# Patient Record
Sex: Female | Born: 1985 | Race: Black or African American | Hispanic: No | Marital: Single | Smoking: Light tobacco smoker
Health system: Southern US, Community
[De-identification: ages and names within clinical notes are randomized; demographics above are authoritative.]

## PROBLEM LIST (undated history)

## (undated) DIAGNOSIS — J45909 Unspecified asthma, uncomplicated: Secondary | ICD-10-CM

---
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
## 2004-05-31 ENCOUNTER — Emergency Department (HOSPITAL_COMMUNITY): Admission: EM | Admit: 2004-05-31 | Discharge: 2004-05-31 | Payer: Self-pay | Admitting: Family Medicine

## 2004-06-19 ENCOUNTER — Emergency Department (HOSPITAL_COMMUNITY): Admission: EM | Admit: 2004-06-19 | Discharge: 2004-06-19 | Payer: Self-pay | Admitting: Family Medicine

## 2004-08-14 ENCOUNTER — Emergency Department (HOSPITAL_COMMUNITY): Admission: EM | Admit: 2004-08-14 | Discharge: 2004-08-14 | Payer: Self-pay | Admitting: Family Medicine

## 2007-01-28 ENCOUNTER — Other Ambulatory Visit: Admission: RE | Admit: 2007-01-28 | Discharge: 2007-01-28 | Payer: Self-pay | Admitting: Obstetrics and Gynecology

## 2007-02-12 ENCOUNTER — Emergency Department (HOSPITAL_COMMUNITY): Admission: EM | Admit: 2007-02-12 | Discharge: 2007-02-12 | Payer: Self-pay | Admitting: Emergency Medicine

## 2007-04-08 ENCOUNTER — Ambulatory Visit (HOSPITAL_COMMUNITY): Admission: RE | Admit: 2007-04-08 | Discharge: 2007-04-08 | Payer: Self-pay | Admitting: Obstetrics and Gynecology

## 2007-06-03 ENCOUNTER — Ambulatory Visit (HOSPITAL_COMMUNITY): Admission: RE | Admit: 2007-06-03 | Discharge: 2007-06-03 | Payer: Self-pay | Admitting: Obstetrics and Gynecology

## 2007-07-08 ENCOUNTER — Inpatient Hospital Stay (HOSPITAL_COMMUNITY): Admission: AD | Admit: 2007-07-08 | Discharge: 2007-07-08 | Payer: Self-pay | Admitting: Obstetrics and Gynecology

## 2007-07-15 ENCOUNTER — Ambulatory Visit (HOSPITAL_COMMUNITY): Admission: RE | Admit: 2007-07-15 | Discharge: 2007-07-15 | Payer: Self-pay | Admitting: Obstetrics and Gynecology

## 2007-07-22 ENCOUNTER — Inpatient Hospital Stay (HOSPITAL_COMMUNITY): Admission: AD | Admit: 2007-07-22 | Discharge: 2007-07-22 | Payer: Self-pay | Admitting: Obstetrics and Gynecology

## 2007-08-03 ENCOUNTER — Inpatient Hospital Stay (HOSPITAL_COMMUNITY): Admission: AD | Admit: 2007-08-03 | Discharge: 2007-08-03 | Payer: Self-pay | Admitting: Obstetrics and Gynecology

## 2007-08-16 ENCOUNTER — Inpatient Hospital Stay (HOSPITAL_COMMUNITY): Admission: AD | Admit: 2007-08-16 | Discharge: 2007-08-19 | Payer: Self-pay | Admitting: Obstetrics and Gynecology

## 2007-08-17 ENCOUNTER — Encounter (INDEPENDENT_AMBULATORY_CARE_PROVIDER_SITE_OTHER): Payer: Self-pay | Admitting: Obstetrics and Gynecology

## 2007-09-29 ENCOUNTER — Encounter: Admission: RE | Admit: 2007-09-29 | Discharge: 2007-10-27 | Payer: Self-pay | Admitting: Orthopedic Surgery

## 2007-10-05 ENCOUNTER — Other Ambulatory Visit: Admission: RE | Admit: 2007-10-05 | Discharge: 2007-10-05 | Payer: Self-pay | Admitting: Obstetrics and Gynecology

## 2009-03-17 IMAGING — US US OB COMP +14 WK
1 series · 14 of 28 positions shown · non-contrast
Comparison: none

OBSTETRICAL ULTRASOUND:

 This ultrasound exam was performed in the [HOSPITAL] Ultrasound Department.  The OB US report was generated in the AS system, and faxed to the ordering physician.  This report is also available in [REDACTED] PACS.

[Series 1: us ob comp +14 wk · 0.28mm/px · 14 of 61 slices shown]
[im 3/61]
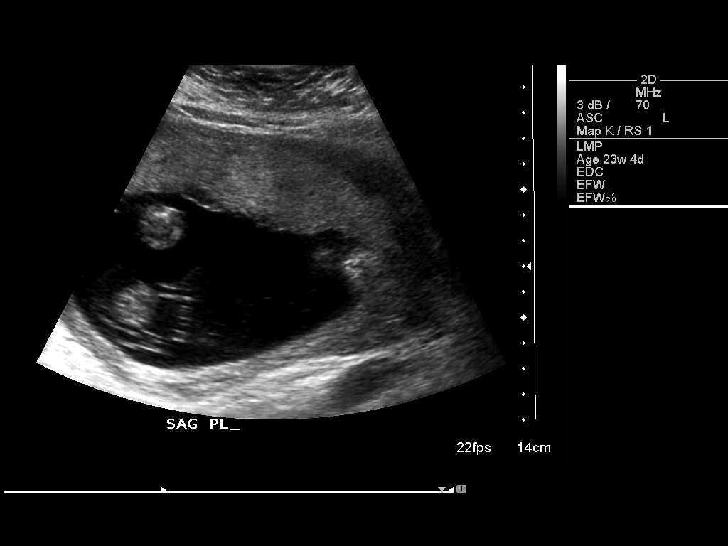
[im 7/61]
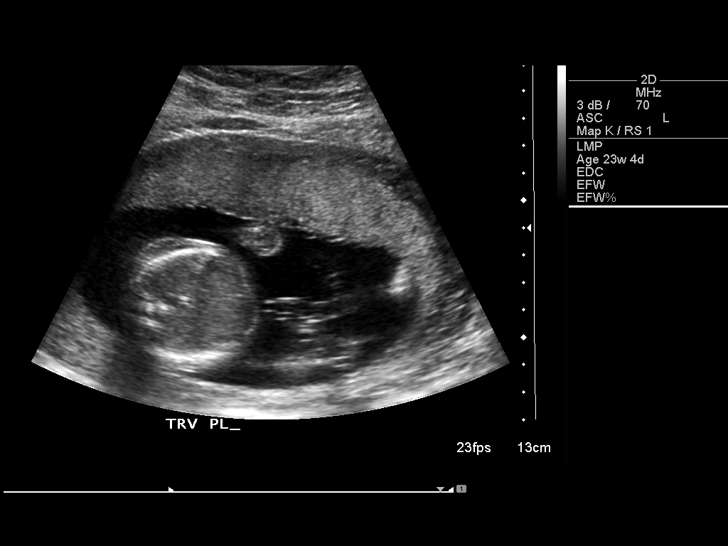
[im 12/61]
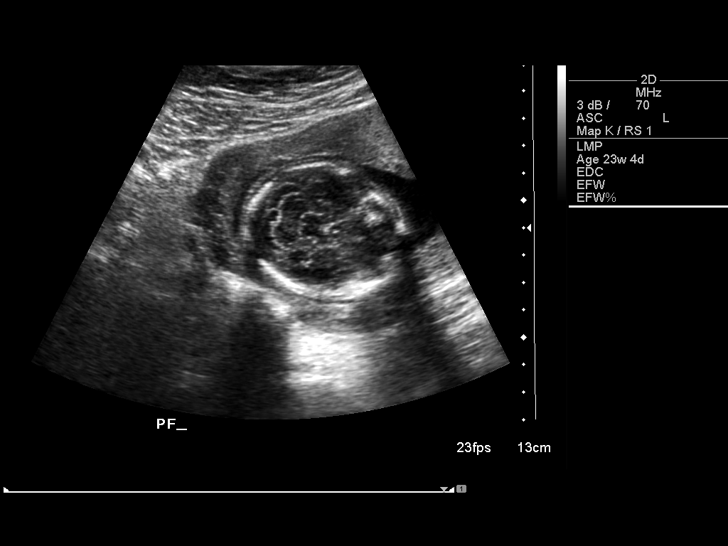
[im 16/61]
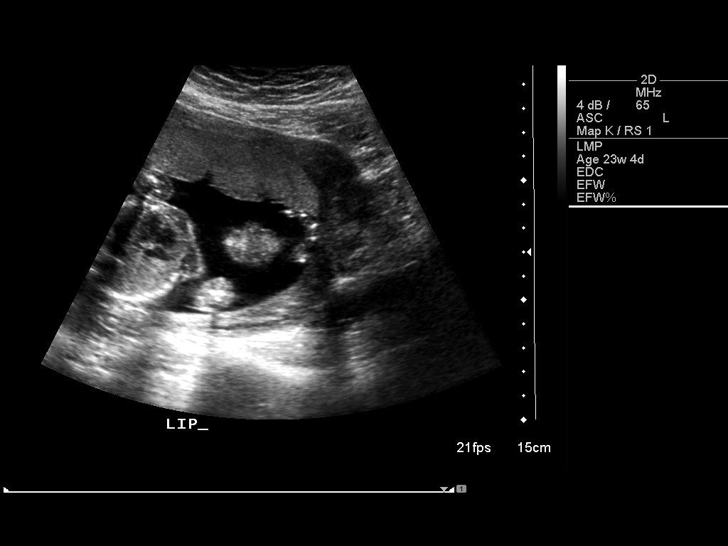
[im 21/61]
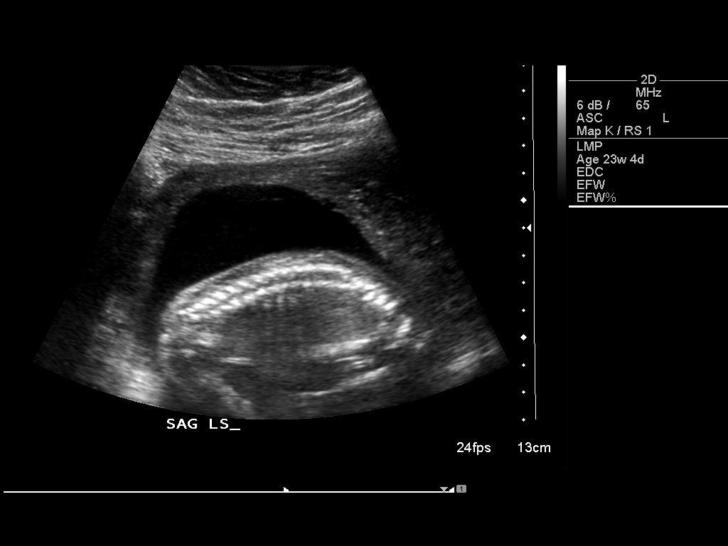
[im 25/61]
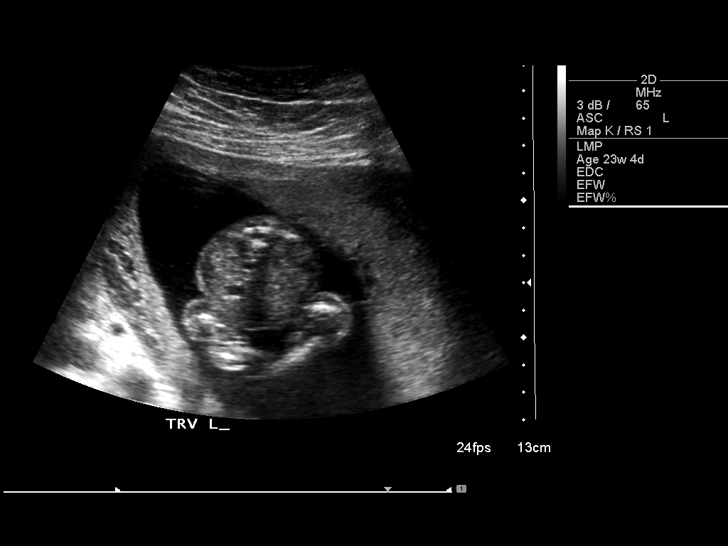
[im 29/61]
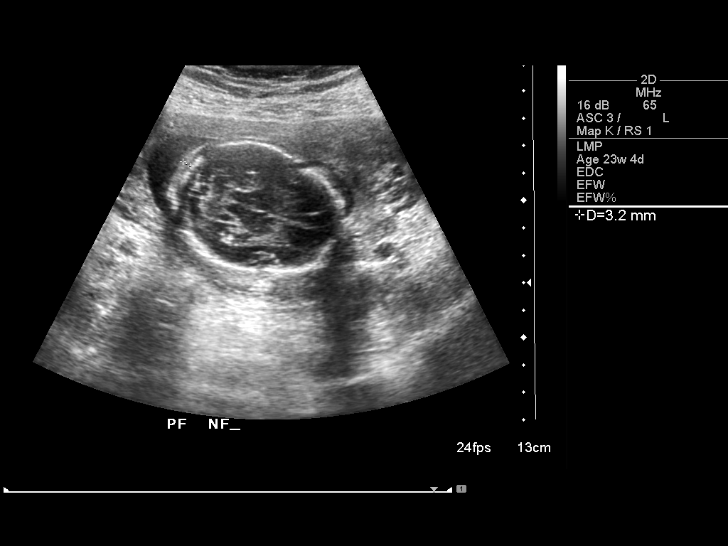
[im 34/61]
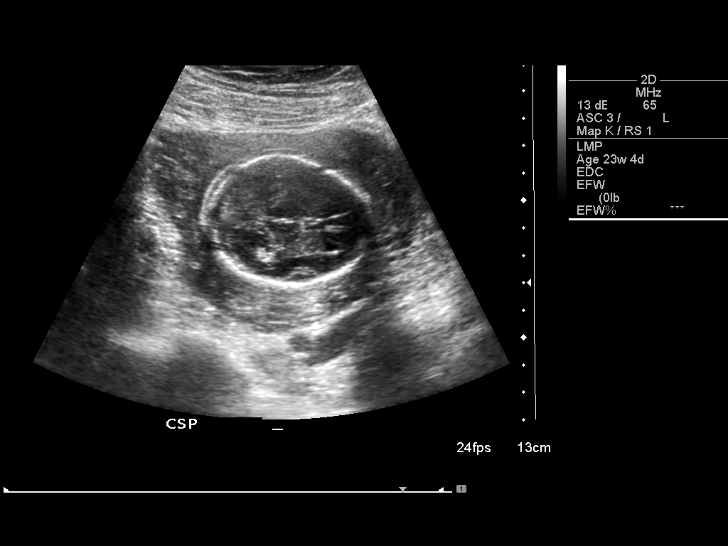
[im 38/61]
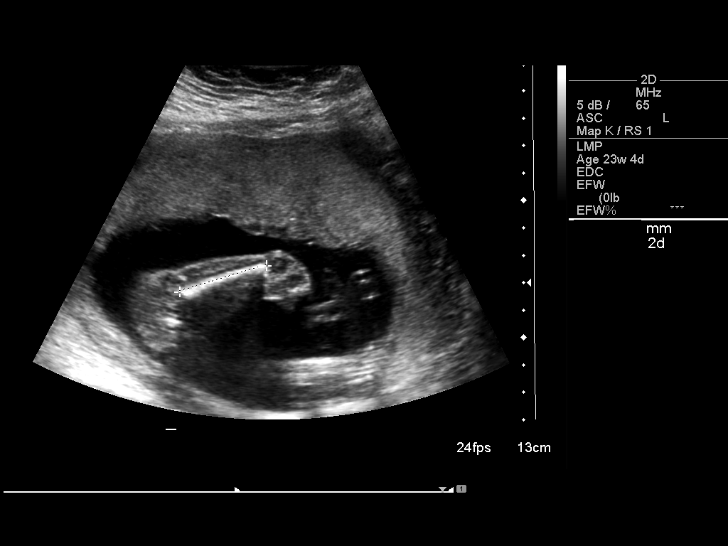
[im 43/61]
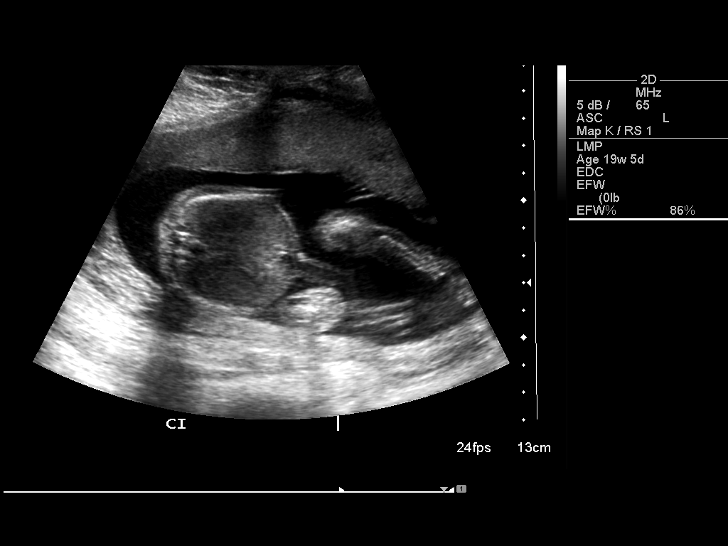
[im 47/61]
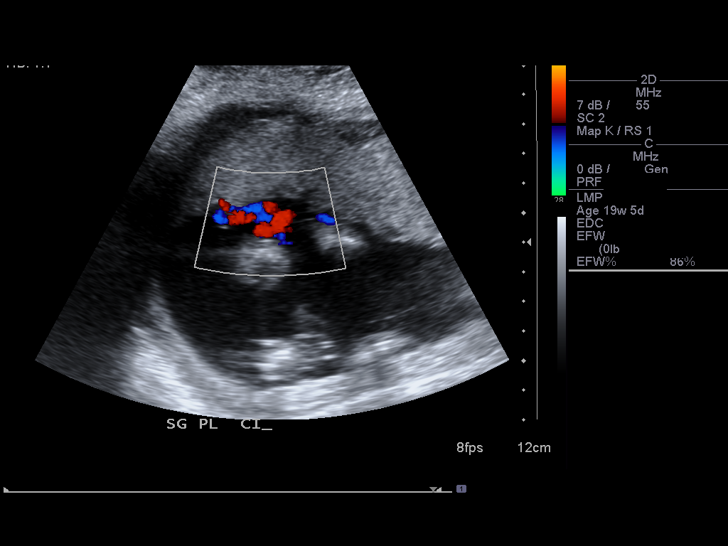
[im 52/61]
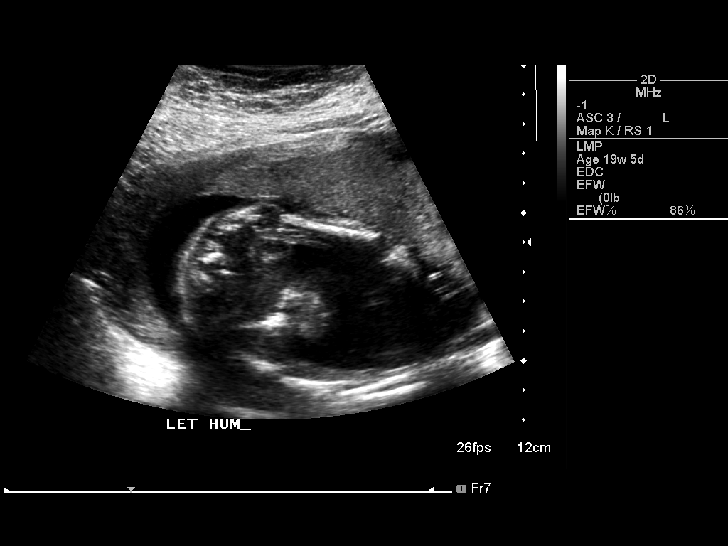
[im 56/61]
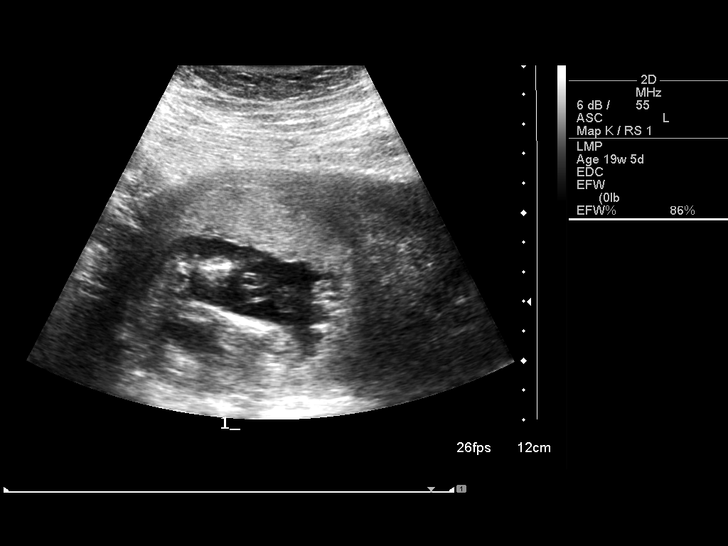
[im 61/61]
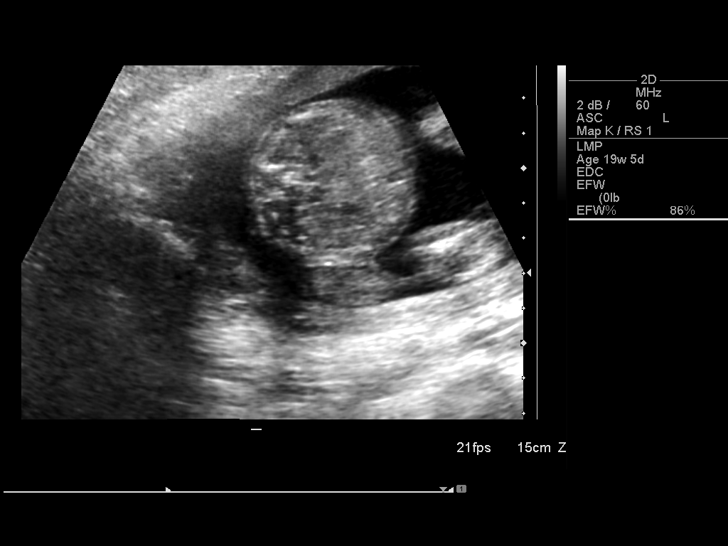

[14 of 28 positions shown; findings below may reference images not displayed]

IMPRESSION: See AS Obstetric US report.

## 2011-01-20 ENCOUNTER — Encounter: Payer: Self-pay | Admitting: Obstetrics and Gynecology

## 2011-05-14 NOTE — Discharge Summary (Signed)
Meghan Rojas, Meghan Rojas              ACCOUNT NO.:  [id number]   MEDICAL RECORD NO.:  [id number]          PATIENT TYPE:  INP   LOCATION:  9148                          FACILITY:  [facility]   PHYSICIAN:  James A. Ashley Royalty, M.D.DATE OF BIRTH:  September 22, 1986   DATE OF ADMISSION:  08/16/2007  DATE OF DISCHARGE:  08/19/2007                               DISCHARGE SUMMARY   DISCHARGE DIAGNOSES:  1. Intrauterine pregnancy at term, delivered.  2. Asthma.  3. Premature rupture of membranes for oligohydramnios.  4. Term birth living child, vertex.   OPERATIONS AND PROCEDURES:  OB delivery with episiotomy, episiorrhaphy.   CONSULTATIONS:  None.   DISCHARGE MEDICATIONS:  Percocet, Motrin 600 mg.   HISTORY AND PHYSICAL:  This is a 25 year old gravida 1 at 25 weeks 3  days gestation with the aforementioned diagnoses.  The patient also  noted to have a two-vessel umbilical cord.  She has been followed with  antepartum surveillance for that.  She presented complaining of possible  rupture of membranes.  Though the evaluation did not reveal ruptured  membranes, an ultrasound performed in the course of evaluation revealed  an AFI to be 2.8, less than the 3rd percentile.  The patient was hence  admitted for delivery.  Cervical examination by me approximately 8:50  a.m. the day of admission revealed cervix to be 4-5 cm dilated, 100%  effaced, minus two station, vertex presentation.  For the remainder of  the history and physical, please see chart.   HOSPITAL COURSE:  The patient admitted to [facility] [place].  Initial laboratory studies were drawn.  She went on to labor and deliver  on August 17, 2007.  The infant was a 6 pounds 15 ounces female, Apgars 8  at one minute, 9 at 5 minutes.  Sent to newborn nursery.  The delivery  was accomplished by Dr. Sylvester Harder over second-degree midline  episiotomy, which was repaired without difficulty.  The patient was  discharged home on the second  postpartum day having had an unremarkable  postpartum course.   DISPOSITION:  The patient is to return to [facility] and  Obstetrics in 6 weeks for postpartum evaluation.      James A. Ashley Royalty, M.D.  Electronically Signed     JAM/MEDQ  D:  09/17/2007  T:  09/17/2007  Job:  [id number]

## 2011-10-11 LAB — CBC
HCT: 32.5 — ABNORMAL LOW
HCT: 38.4
Hemoglobin: 11.4 — ABNORMAL LOW
MCHC: 34.2
MCHC: 34.3
MCHC: 35
MCV: 84.6
RBC: 3.85 — ABNORMAL LOW
RBC: 3.97
RBC: 4.55
RDW: 15.7 — ABNORMAL HIGH
RDW: 15.8 — ABNORMAL HIGH
WBC: 12.7 — ABNORMAL HIGH

## 2011-10-11 LAB — RPR: RPR Ser Ql: NONREACTIVE

## 2011-10-15 LAB — CBC
HCT: 35.5 — ABNORMAL LOW
MCV: 85.5
RBC: 4.16

## 2011-10-15 LAB — KLEIHAUER-BETKE STAIN: Fetal Cells %: 0

## 2012-11-01 ENCOUNTER — Encounter (HOSPITAL_COMMUNITY): Payer: Self-pay | Admitting: Nurse Practitioner

## 2012-11-01 ENCOUNTER — Emergency Department (HOSPITAL_COMMUNITY)
Admission: EM | Admit: 2012-11-01 | Discharge: 2012-11-01 | Disposition: A | Discharge status: Home / Self Care | Payer: Self-pay | Attending: Emergency Medicine | Admitting: Emergency Medicine

## 2012-11-01 DIAGNOSIS — J45901 Unspecified asthma with (acute) exacerbation: Secondary | ICD-10-CM | POA: Insufficient documentation

## 2012-11-01 DIAGNOSIS — E669 Obesity, unspecified: Secondary | ICD-10-CM | POA: Insufficient documentation

## 2012-11-01 DIAGNOSIS — R0600 Dyspnea, unspecified: Secondary | ICD-10-CM

## 2012-11-01 DIAGNOSIS — R059 Cough, unspecified: Secondary | ICD-10-CM | POA: Insufficient documentation

## 2012-11-01 DIAGNOSIS — R05 Cough: Secondary | ICD-10-CM | POA: Insufficient documentation

## 2012-11-01 HISTORY — DX: Unspecified asthma, uncomplicated: J45.909

## 2012-11-01 MED ORDER — AEROCHAMBER PLUS FLO-VU SMALL MISC
1.0000 | Freq: Once | Status: DC
  Filled 2012-11-01: qty 1

## 2012-11-01 MED ORDER — AEROCHAMBER PLUS W/MASK MISC
Status: AC
  Filled 2012-11-01: qty 1

## 2012-11-01 MED ORDER — ALBUTEROL SULFATE HFA 108 (90 BASE) MCG/ACT IN AERS
2.0000 | INHALATION_SPRAY | RESPIRATORY_TRACT | Status: DC | PRN
  Administered 2012-11-01: 2 via RESPIRATORY_TRACT
  Filled 2012-11-01 (×2): qty 6.7

## 2012-11-01 NOTE — ED Provider Notes (Signed)
History     CSN: [id number]  Arrival date & time 11/01/12  1054   First MD Initiated Contact with Patient 11/01/12 1130      Chief Complaint  Patient presents with  . Asthma    (Consider location/radiation/quality/duration/timing/severity/associated sxs/prior treatment) HPI Patient developed wheezing and nonproductive cough onset one hour prior to coming here. She was treated with by EMS with a nebulizer treatment. She is breathing normally presently. Completely asymptomatic. She denies fever cough is nonproductive. Has since resolved no other complaint Past Medical History  Diagnosis Date  . Asthma    Last asthma attack 18 years ago History reviewed. No pertinent past surgical history.  History reviewed. No pertinent family history.  History  Substance Use Topics  . Smoking status: Never Smoker   . Smokeless tobacco: Not on file  . Alcohol Use: Yes    OB History    Grav Para Term Preterm Abortions TAB SAB Ect Mult Living                  Review of Systems  Constitutional: Negative.   HENT: Negative.   Respiratory: Positive for cough and wheezing.   Cardiovascular: Negative.   Gastrointestinal: Negative.   Musculoskeletal: Negative.   Skin: Negative.   Neurological: Negative.   Hematological: Negative.   Psychiatric/Behavioral: Negative.   All other systems reviewed and are negative.    Allergies  Review of patient's allergies indicates no known allergies.  Home Medications  No current outpatient prescriptions on file.  BP 132/71  Pulse 94  Temp 99.7 F (37.6 C) (Oral)  Resp 20  SpO2 100%  Physical Exam  Nursing note and vitals reviewed. Constitutional: She appears well-developed and well-nourished.  HENT:  Head: Normocephalic and atraumatic.  Eyes: Conjunctivae normal are normal. Pupils are equal, round, and reactive to light.  Neck: Neck supple. No tracheal deviation present. No thyromegaly present.  Cardiovascular: Normal rate and regular  rhythm.   No murmur heard. Pulmonary/Chest: Effort normal and breath sounds normal.  Abdominal: Soft. Bowel sounds are normal. She exhibits no distension. There is no tenderness.       OBese  Musculoskeletal: Normal range of motion. She exhibits no edema and no tenderness.  Neurological: She is alert. Coordination normal.  Skin: Skin is warm and dry. No rash noted.  Psychiatric: She has a normal mood and affect.    ED Course  Procedures (including critical care time)  Labs Reviewed - No data to display No results found.   No diagnosis found.    MDM  Wheezing and cough resolved with one nebulized treatment. Plan albuterol HFA to go to use 2 puffs every 4 hours when necessary shortness of breath or cough referral resource guide Diagnosis dyspnea        Doug Sou, MD 11/01/12 1308

## 2012-11-01 NOTE — ED Notes (Signed)
Per ems: pt reports history of asthma but has not needed inhalers for past year. Onset SOB this am, on arrival to scene mild wheezing noted sats were 93% on RA, pt appeared anxious. En route ems administered 5mg  albuterol neb and pt is feeling much better

## 2012-12-10 ENCOUNTER — Emergency Department (HOSPITAL_COMMUNITY)
Admission: EM | Admit: 2012-12-10 | Discharge: 2012-12-10 | Disposition: A | Discharge status: Home / Self Care | Payer: Self-pay | Attending: Emergency Medicine | Admitting: Emergency Medicine

## 2012-12-10 ENCOUNTER — Encounter (HOSPITAL_COMMUNITY): Payer: Self-pay | Admitting: Emergency Medicine

## 2012-12-10 DIAGNOSIS — F17200 Nicotine dependence, unspecified, uncomplicated: Secondary | ICD-10-CM | POA: Insufficient documentation

## 2012-12-10 DIAGNOSIS — J45909 Unspecified asthma, uncomplicated: Secondary | ICD-10-CM | POA: Insufficient documentation

## 2012-12-10 DIAGNOSIS — R21 Rash and other nonspecific skin eruption: Secondary | ICD-10-CM | POA: Insufficient documentation

## 2012-12-10 DIAGNOSIS — Z79899 Other long term (current) drug therapy: Secondary | ICD-10-CM | POA: Insufficient documentation

## 2012-12-10 MED ORDER — NYSTATIN-TRIAMCINOLONE 100000-0.1 UNIT/GM-% EX CREA: TOPICAL_CREAM | CUTANEOUS | Status: AC

## 2012-12-10 MED ORDER — CEPHALEXIN 500 MG PO CAPS: 500.0000 mg | ORAL_CAPSULE | Freq: Four times a day (QID) | ORAL | Status: AC

## 2012-12-10 NOTE — ED Provider Notes (Signed)
History     CSN: [id number]  Arrival date & time 12/10/12  6213   First MD Initiated Contact with Patient 12/10/12 1901      No chief complaint on file.   (Consider location/radiation/quality/duration/timing/severity/associated sxs/prior treatment) HPI C/o rash on  Dorsal hand present and worsening over past 2 months. Begins as vesicle.  intensely pruritic, weeping and crusting. Non-painful. Past history of recurrent rash that patient has attributed to eczema, without formal diagnosis.  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  Her children were recently dxd with impetigo,however patient has had rash present longer. Denies contacts with similar,changes in lotions/soaps/detergents, exposure to animal or plant irritants, and denies purulent discharge.   Discussed reasons to seek immediate care. Patient expresses understanding and agrees with plan.  Past Medical History  Diagnosis Date  . Asthma     History reviewed. No pertinent past surgical history.  No family history on file.  History  Substance Use Topics  . Smoking status: Light Tobacco Smoker  . Smokeless tobacco: Not on file  . Alcohol Use: Yes    OB History    Grav Para Term Preterm Abortions TAB SAB Ect Mult Living                  Review of Systems Ten systems reviewed and are negative for acute change, except as noted in the HPI.   Allergies  Review of patient's allergies indicates no known allergies.  Home Medications   Current Outpatient Rx  Name  Route  Sig  Dispense  Refill  . ALBUTEROL SULFATE HFA 108 (90 BASE) MCG/ACT IN AERS   Inhalation   Inhale 2 puffs into the lungs every 6 (six) hours as needed. breathing         . IBUPROFEN 200 MG PO TABS   Oral   Take 400 mg by mouth every 6 (six) hours as needed. Pain           BP 156/107  Pulse 79  Temp 98.6 F (37 C) (Oral)  Resp 20  Wt 245 lb (111.131 kg)  SpO2 100%  Physical Exam Physical Exam  Nursing note  and vitals reviewed. Constitutional: She is oriented to person, place, and time. She appears well-developed and well-nourished. No distress.  HENT:  Head: Normocephalic and atraumatic.  Eyes: Conjunctivae normal and EOM are normal. Pupils are equal, round, and reactive to light. No scleral icterus.  Neck: Normal range of motion.  Cardiovascular: Normal rate, regular rhythm and normal heart sounds.  Exam reveals no gallop and no friction rub.   No murmur heard. Pulmonary/Chest: Effort normal and breath sounds normal. No respiratory distress.  Abdominal: Soft. Bowel sounds are normal. She exhibits no distension and no mass. There is no tenderness. There is no guarding.  Neurological: She is alert and oriented to person, place, and time.  Skin: Skin is warm and dry. She is not diaphoretic.  Area of hypertrophic, thickened plaques on dorsal hand and fingers 2/3/4 . 3 cm annular patch on the anterior surface of the forearm. There serous discharge and crusting on the fingers. No signs of infection.   ED Course  Procedures (including critical care time)  Labs Reviewed - No data to display No results found.   1. Rash       MDM  Patient with dermatitis that may represent eczema, dishydrotic dermatitis , and/or have fungal components as well. I will cover the patient for staph infection and have urged  her to follow up with dermatology. Discussed reasons to seek immediate care. Patient expresses understanding and agrees with plan.        Arthor Captain, PA-C 12/11/12 1131

## 2012-12-10 NOTE — ED Notes (Signed)
Pt with blister like rash on right fourth finger with drainage.  Pt reports her children were diagnosed with impetigo recently and treated with Bactriban and an oral antibiotic.  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

## 2012-12-12 NOTE — ED Provider Notes (Signed)
Medical screening examination/treatment/procedure(s) were performed by non-physician practitioner and as supervising physician I was immediately available for consultation/collaboration.   Rosezella Kronick R Lasharn Bufkin, MD 12/12/12 0113 

## 2016-06-24 ENCOUNTER — Encounter (HOSPITAL_COMMUNITY): Payer: Self-pay | Admitting: *Deleted

## 2016-06-24 ENCOUNTER — Emergency Department (HOSPITAL_COMMUNITY)
Admission: EM | Admit: 2016-06-24 | Discharge: 2016-06-24 | Disposition: A | Discharge status: Home / Self Care | Payer: Self-pay | Attending: Emergency Medicine | Admitting: Emergency Medicine

## 2016-06-24 DIAGNOSIS — J45909 Unspecified asthma, uncomplicated: Secondary | ICD-10-CM | POA: Insufficient documentation

## 2016-06-24 DIAGNOSIS — F17200 Nicotine dependence, unspecified, uncomplicated: Secondary | ICD-10-CM | POA: Insufficient documentation

## 2016-06-24 DIAGNOSIS — K0889 Other specified disorders of teeth and supporting structures: Secondary | ICD-10-CM | POA: Insufficient documentation

## 2016-06-24 MED ORDER — NAPROXEN 500 MG PO TABS: 500.0000 mg | ORAL_TABLET | Freq: Two times a day (BID) | ORAL | Status: AC

## 2016-06-24 MED ORDER — PENICILLIN V POTASSIUM 500 MG PO TABS: 500.0000 mg | ORAL_TABLET | Freq: Four times a day (QID) | ORAL | Status: AC

## 2016-06-24 NOTE — ED Notes (Signed)
Declined W/C at D/C and was escorted to lobby by RN. 

## 2016-06-24 NOTE — ED Provider Notes (Signed)
CSN: [id number]     Arrival date & time 06/24/16  [id number] History  By signing my name below, I, Emmanuella Mensah, attest that this documentation has been prepared under the direction and in the presence of Sharilyn SitesLisa Anand Tejada, PA-C. Electronically Signed: Angelene GiovanniEmmanuella Mensah, ED Scribe. 06/24/2016. 9:51 AM.    Chief Complaint  Patient presents with  . Dental Pain   The history is provided by the patient. No language interpreter was used.   HPI Comments: Meghan Rojas is a 30 y.o. female who presents to the Emergency Department complaining of gradually worsening moderate left upper dental pain onset several days ago. She explains that she had a broken tooth there a while ago but the pain began several days ago. No alleviating factors noted. Pt has not tried any medications PTA. She states that [place] from [place], [place] recently and does not have a Education officer, communitydentist in [place]. She reports NKDA. She denies any fever or trouble swallowing.    Past Medical History  Diagnosis Date  . Asthma    History reviewed. No pertinent past surgical history. History reviewed. No pertinent family history. Social History  Substance Use Topics  . Smoking status: Light Tobacco Smoker  . Smokeless tobacco: None  . Alcohol Use: Yes   OB History    No data available     Review of Systems  Constitutional: Negative for fever.  HENT: Positive for dental problem. Negative for trouble swallowing.   All other systems reviewed and are negative.     Allergies  Fish allergy  Home Medications   Prior to Admission medications   Medication Sig Start Date End Date Taking? Authorizing Provider  albuterol (PROVENTIL HFA;VENTOLIN HFA) 108 (90 BASE) MCG/ACT inhaler Inhale 2 puffs into the lungs every 6 (six) hours as needed. breathing    Historical Provider, MD  cephALEXin (KEFLEX) 500 MG capsule Take 1 capsule (500 mg total) by mouth 4 (four) times daily. 12/10/12   Arthor CaptainAbigail Harris, PA-C  ibuprofen (ADVIL,MOTRIN) 200 MG tablet  Take 400 mg by mouth every 6 (six) hours as needed. Pain    Historical Provider, MD  nystatin-triamcinolone (MYCOLOG II) cream Apply to affected area daily 12/10/12   Arthor CaptainAbigail Harris, PA-C   BP 150/103 mmHg  Pulse 84  Temp(Src) 98.1 F (36.7 C) (Oral)  Resp 18  Ht 5\' 3"  (1.6 m)  Wt 274 lb (124.286 kg)  BMI 48.55 kg/m2  SpO2 100%  LMP 06/10/2016   Physical Exam  Constitutional: She is oriented to person, place, and time. She appears well-developed and well-nourished. No distress.  HENT:  Head: Normocephalic and atraumatic.  Mouth/Throat: Oropharynx is clear and moist.  Teeth largely in fair dentition, left upper molar broken with large defect and cavity present, surrounding gingiva overall normal in appearance, handling secretions appropriately, no trismus, no facial or neck swelling, normal phonation without stridor  Eyes: Conjunctivae and EOM are normal. Pupils are equal, round, and reactive to light.  Neck: Normal range of motion.  Cardiovascular: Normal rate, regular rhythm and normal heart sounds.   Pulmonary/Chest: Effort normal and breath sounds normal. No respiratory distress. She has no wheezes.  Abdominal: Soft. Bowel sounds are normal.  Musculoskeletal: Normal range of motion.  Neurological: She is alert and oriented to person, place, and time.  Skin: Skin is warm and dry. She is not diaphoretic.  Psychiatric: She has a normal mood and affect.  Nursing note and vitals reviewed.   ED Course  Procedures (including critical care time) DIAGNOSTIC STUDIES:  Oxygen Saturation is 100% on RA, normal by my interpretation.    COORDINATION OF CARE: 9:46 AM- Pt advised of plan for treatment and pt agrees. Pt will receive Penicillin. Will provide resources for dental follow up.   MDM   Final diagnoses:  Pain, dental   30 year old female here with left upper dental pain due to a broken tooth. States has been broken for quite some time but has not been bothering her until  recently. Patient is afebrile, nontoxic. Left upper molar is broken with large defect and cavity noted.  There is no facial or neck swelling. Handling secretions well, no stridor. Suspect developing dental infection, no definitive abscess at this time. Clinically not concerning for Ludwig's angina.  Will start on abx, refer to dentist.  Discussed plan with patient, he/she acknowledged understanding and agreed with plan of care.  Return precautions given for new or worsening symptoms.  I personally performed the services described in this documentation, which was scribed in my presence. The recorded information has been reviewed and is accurate.  Garlon HatchetLisa M Jet Traynham, PA-C 06/24/16 1009  Blane OharaJoshua Zavitz, MD 06/24/16 [phone]

## 2016-06-24 NOTE — ED Notes (Signed)
Pt reports pain at site of broken tooth . Toth locatged on Lt upper site.

## 2016-06-24 NOTE — Discharge Instructions (Signed)
Take the prescribed medication as directed. Follow-up with on call dentist-- call their office to make appt.  Otherwise, may follow-up with one of the clinics below. Return to the ED for new or worsening symptoms.  [employer] [facility] [employer] is a great source of information about community services available.  Access by dialing 2-1-1 from anywhere in [place], or by website -  PooledIncome.plwww.nc211.org.   Other Local Resources (Updated 12/2015)  Dental  Care   Services    Phone Number and Address  Cost  [facility] For children 770 - 821 years of age:   Cleaning  Tooth brushing/flossing instruction  Sealants, fillings, crowns  Extractions  Emergency treatment  [phone] [address] [place], [place] [address] Charges based on family income.  Medicaid and some insurance plans accepted.     [facility]  Cleaning  Sealants, fillings, crowns  Extractions  Emergency treatment [phone] [address] [place], [place]  Pregnant women 32 years of age or older with a Medicaid card  [facility] [place]  Cleaning  Sealants, fillings, crowns  Extractions  Emergency treatment [phone] [address] [place], [place] Pregnant women 32 years of age or older with a Medicaid card  [facility] For children 740 - 32 years of age:   Cleaning  Tooth brushing/flossing instruction  Sealants, fillings, crowns  Extractions  Emergency treatment Limited orthodontic services for patients with Medicaid [phone] [address] [place], [place] [address] Medicaid and [facility] Health Choice cover for children up to age 32 and pregnant women.  Parents of children up to age 32 without Medicaid pay a reduced fee at time of service.  [facility] [place]  For children 720 - 32 years of age:   Cleaning  Tooth brushing/flossing instruction  Sealants, fillings, crowns  Extractions  Emergency treatment Limited orthodontic services for patients with Medicaid [phone] [address] [place], [place].  Medicaid and [place] Health Choice cover for children up to age 32 and pregnant women.  Parents of children up to age 32 without Medicaid pay a reduced fee.  [facility]  Cleaning  Sealants, fillings, crowns  Extractions  Hours: Tuesdays and Thursdays, 4:15 - 8 pm [phone] [address], [address] [place], [place] [address] Services free of charge to [facility] residents ages 18-64 who do not have health insurance, Medicare, [place], or [place] benefits and fall within federal poverty guidelines  [facility]    Provides dental care in addition to primary medical care, nutritional counseling, and pharmacy:  Nurse, mental healthCleaning  Sealants, fillings, crowns  Extractions                  [phone] [facility], [address] [place], [place]  [phone] [facility], [address] [place], [place]  [phone] [facility] [place], [place]  [phone] [facility], [address] [place], [place]  [phone] [facility] [address] [place], [place] Accepts [place], [place]are, most insurance.  Also provides services available to all with fees adjusted based on ability to pay.    [facility]  Cleaning  Tooth brushing/flossing instruction  Sealants, fillings, crowns  Extractions  Emergency treatment Hours: Tuesdays, Thursdays,  and Fridays from 8 am to 5 pm by appointment only. [phone] [address] [place], [place] [phone] [facility] residents with Medicaid (depending on eligibility) and children  with [facility] Health Choice - call for more information.  [facility]  Extractions only  Hours: 2nd and 4th Thursday of each month from 6:30 am - 9 am.   [phone] [address] [place], [place] [address] Ages 6418 and older only.  Patients are seen on a first come, first served basis.  [employer]  [employer]  Fillings  Extractions  Orthodontics  Endodontics  Implants/Crowns/Bridges  Complete and partial dentures [phone] [place], [place] Patients must complete an application for services.  There is often a waiting list.

## 2016-10-05 ENCOUNTER — Encounter (HOSPITAL_COMMUNITY): Payer: Self-pay | Admitting: Oncology

## 2016-10-05 ENCOUNTER — Emergency Department (HOSPITAL_COMMUNITY)
Admission: EM | Admit: 2016-10-05 | Discharge: 2016-10-05 | Disposition: A | Discharge status: Home / Self Care | Payer: Self-pay | Attending: Emergency Medicine | Admitting: Emergency Medicine

## 2016-10-05 DIAGNOSIS — Z792 Long term (current) use of antibiotics: Secondary | ICD-10-CM | POA: Insufficient documentation

## 2016-10-05 DIAGNOSIS — R21 Rash and other nonspecific skin eruption: Secondary | ICD-10-CM | POA: Insufficient documentation

## 2016-10-05 DIAGNOSIS — J45909 Unspecified asthma, uncomplicated: Secondary | ICD-10-CM | POA: Insufficient documentation

## 2016-10-05 DIAGNOSIS — Z79899 Other long term (current) drug therapy: Secondary | ICD-10-CM | POA: Insufficient documentation

## 2016-10-05 DIAGNOSIS — F17210 Nicotine dependence, cigarettes, uncomplicated: Secondary | ICD-10-CM | POA: Insufficient documentation

## 2016-10-05 MED ORDER — HYDROCORTISONE 2.5 % EX LOTN: TOPICAL_LOTION | Freq: Two times a day (BID) | CUTANEOUS | 0 refills | Status: AC

## 2016-10-05 MED ORDER — PREDNISONE 20 MG PO TABS: 60.0000 mg | ORAL_TABLET | Freq: Every day | ORAL | 0 refills | Status: AC

## 2016-10-05 MED ORDER — PREDNISONE 20 MG PO TABS
60.0000 mg | ORAL_TABLET | Freq: Once | ORAL | Status: AC
  Administered 2016-10-05: 60 mg via ORAL
  Filled 2016-10-05: qty 3

## 2016-10-05 NOTE — ED Triage Notes (Signed)
Per pt approximately 3 weeks ago she felt a sting on her right foot, did not see an insect.  Since that time pt has developed 2 circular wounds on her right foot.  One measures 1cm x 1cm the other is 4cm x 5 cm both areas are weeping.  Surrounding area is not warm or red.  Swelling is noted to b/l feet.  Pt denies pain however endorses itching at site.  Pt had been using witch hazel on the site and taking benadryl for the itching.

## 2016-10-05 NOTE — ED Provider Notes (Signed)
[facility]-EMERGENCY DEPT Provider Note   CSN: [id number] Arrival date & time: 10/05/16  2024     History   Chief Complaint Chief Complaint  Patient presents with  . Insect Bite    HPI Meghan Rojas is a 30 y.o. female.  Patient presents today with two separate lesions of the dorsal aspect of her right foot.  She states that she felt something sting her 3 weeks ago, but is unsure what it was that stung her.  Two weeks ago she developed the lesions on the foot. She states that the lesions are not painful, but do itch. She states that she has noticed clear drainage from the lesions.  She has been applying Witch Hazel to the lesions.  She denies fever, chills, numbness, or tingling.        Past Medical History:  Diagnosis Date  . Asthma     There are no active problems to display for this patient.   History reviewed. No pertinent surgical history.  OB History    No data available       Home Medications    Prior to Admission medications   Medication Sig Start Date End Date Taking? Authorizing Provider  albuterol (PROVENTIL HFA;VENTOLIN HFA) 108 (90 BASE) MCG/ACT inhaler Inhale 2 puffs into the lungs every 6 (six) hours as needed. breathing    Historical Provider, MD  cephALEXin (KEFLEX) 500 MG capsule Take 1 capsule (500 mg total) by mouth 4 (four) times daily. 12/10/12   Arthor Captain, PA-C  ibuprofen (ADVIL,MOTRIN) 200 MG tablet Take 400 mg by mouth every 6 (six) hours as needed. Pain    Historical Provider, MD  naproxen (NAPROSYN) 500 MG tablet Take 1 tablet (500 mg total) by mouth 2 (two) times daily with a meal. 06/24/16   Garlon Hatchet, PA-C  nystatin-triamcinolone (MYCOLOG II) cream Apply to affected area daily 12/10/12   Arthor Captain, PA-C  penicillin v potassium (VEETID) 500 MG tablet Take 1 tablet (500 mg total) by mouth 4 (four) times daily. 06/24/16   Garlon Hatchet, PA-C    Family History No family history on file.  Social History Social History    Substance Use Topics  . Smoking status: Light Tobacco Smoker    Types: Cigarettes  . Smokeless tobacco: Never Used  . Alcohol use Yes     Allergies   Fish allergy   Review of Systems Review of Systems  All other systems reviewed and are negative.    Physical Exam Updated Vital Signs BP (!) 178/108 (BP Location: Right Arm)   Pulse 80   Temp 98.5 F (36.9 C) (Oral)   Resp 18   Ht 5\' 3"  (1.6 m)   Wt 112 kg   LMP 09/29/2016 (Exact Date)   SpO2 100%   BMI 43.75 kg/m   Physical Exam  Constitutional: She appears well-developed and well-nourished.  HENT:  Head: Normocephalic and atraumatic.  Neck: Normal range of motion. Neck supple.  Cardiovascular: Normal rate, regular rhythm and normal heart sounds.   Pulmonary/Chest: Effort normal and breath sounds normal.  Neurological: She is alert.  Distal sensation of the right foot intact  Skin: Skin is warm and dry.  2 separate hyperpigmented plaque like areas of the dorsal aspect of the right foot with serous drainage.  No purulent drainage. No surrounding erythema, edema, or warmth.  No signs of necrosis.   Psychiatric: She has a normal mood and affect.  Nursing note and vitals reviewed.    ED  Treatments / Results  Labs (all labs ordered are listed, but only abnormal results are displayed) Labs Reviewed - No data to display  EKG  EKG Interpretation None       Radiology No results found.  Procedures Procedures (including critical care time)  Medications Ordered in ED Medications - No data to display   Initial Impression / Assessment and Plan / ED Course  I have reviewed the triage vital signs and the nursing notes.  Pertinent labs & imaging results that were available during my care of the patient were reviewed by me and considered in my medical decision making (see chart for details).  Clinical Course    Final Clinical Impressions(s) / ED Diagnoses   Final diagnoses:  None   Patient presents  today with two separate hyperpigmented scaly plaque like lesions to the dorsal aspect of her foot x 2 weeks.  She states that she was stung by something 3 weeks ago, but is unsure what stung her.  She denies pain, but reports that the lesions itch.  Appearance consistent with eczema.  She does have a history of eczema.  No signs of infection on exam.  No areas of necrosis.  Feel that the patient is stable for discharge.  Given course of prednisone and hydrocortisone cream.  Stable for discharge.  Return precautions given.  Patient also evaluated by Dr Patria Maneampos who is in agreement with the plan.   New Prescriptions New Prescriptions   No medications on file     Santiago GladHeather Marieta Markov, Cordelia Poche-C 10/06/16 2307    Azalia BilisKevin Campos, MD 10/07/16 [id number]
